# Patient Record
Sex: Female | Born: 1995 | Race: Black or African American | Hispanic: No | Marital: Single | Smoking: Never smoker
Health system: Southern US, Community
[De-identification: ages and names within clinical notes are randomized; demographics above are authoritative.]

## PROBLEM LIST (undated history)

## (undated) DIAGNOSIS — J302 Other seasonal allergic rhinitis: Secondary | ICD-10-CM

## (undated) DIAGNOSIS — M779 Enthesopathy, unspecified: Secondary | ICD-10-CM

---
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
## 2015-03-14 ENCOUNTER — Emergency Department (INDEPENDENT_AMBULATORY_CARE_PROVIDER_SITE_OTHER)
Admission: EM | Admit: 2015-03-14 | Discharge: 2015-03-14 | Disposition: A | Discharge status: Home / Self Care | Payer: Medicaid Other | Source: Home / Self Care | Attending: Emergency Medicine | Admitting: Emergency Medicine

## 2015-03-14 ENCOUNTER — Encounter (HOSPITAL_COMMUNITY): Payer: Self-pay | Admitting: *Deleted

## 2015-03-14 DIAGNOSIS — J0140 Acute pansinusitis, unspecified: Secondary | ICD-10-CM

## 2015-03-14 HISTORY — DX: Enthesopathy, unspecified: M77.9

## 2015-03-14 HISTORY — DX: Other seasonal allergic rhinitis: J30.2

## 2015-03-14 MED ORDER — FLUTICASONE PROPIONATE 50 MCG/ACT NA SUSP: 1.0000 | Freq: Every day | NASAL | Status: DC

## 2015-03-14 MED ORDER — AMOXICILLIN-POT CLAVULANATE 875-125 MG PO TABS: 1.0000 | ORAL_TABLET | Freq: Two times a day (BID) | ORAL | Status: DC

## 2015-03-14 MED ORDER — CETIRIZINE HCL 10 MG PO TABS: 10.0000 mg | ORAL_TABLET | Freq: Every day | ORAL | Status: AC

## 2015-03-14 NOTE — ED Provider Notes (Signed)
CSN: [id number]     Arrival date & time 03/14/15  1910 History   First MD Initiated Contact with Patient 03/14/15 2000     Chief Complaint  Patient presents with  . Facial Pain   (Consider location/radiation/quality/duration/timing/severity/associated sxs/prior Treatment) HPI  She is a 19 year old woman here for evaluation of sinus pressure. She states she's had some issues with her sinuses for a month or so due to seasonal allergies. She has been taking Claritin daily, but stopped this when it didn't help much. In the last 3 days she has developed worsening sinus pressure, nasal congestion, headache, chills. No fevers. She does report a mild cough. No ear pain. She states she used to have worse problems with allergies, but this has been improved after her nasal surgery.  Past Medical History  Diagnosis Date  . Seasonal allergies   . Inflammation around joint     bilateral hips   Past Surgical History  Procedure Laterality Date  . Sinoplasty  2012    had something else with it, polyps removed   History reviewed. No pertinent family history. History  Substance Use Topics  . Smoking status: Never Smoker   . Smokeless tobacco: Not on file  . Alcohol Use: No   OB History    No data available     Review of Systems  Constitutional: Positive for chills. Negative for fever.  HENT: Positive for congestion and sinus pressure. Negative for ear pain, rhinorrhea, sore throat and trouble swallowing.   Respiratory: Positive for cough. Negative for shortness of breath.   Neurological: Positive for headaches.    Allergies  Zofran and Codeine  Home Medications   Prior to Admission medications   Medication Sig Start Date End Date Taking? Authorizing Provider  levonorgestrel-ethinyl estradiol (NORDETTE) 0.15-30 MG-MCG tablet Take 1 tablet by mouth daily.   Yes Historical Provider, MD  meloxicam (MOBIC) 15 MG tablet Take 15 mg by mouth daily.   Yes Historical Provider, MD   amoxicillin-clavulanate (AUGMENTIN) 875-125 MG per tablet Take 1 tablet by mouth 2 (two) times daily. 03/14/15   Charm Rings, MD  cetirizine (ZYRTEC) 10 MG tablet Take 1 tablet (10 mg total) by mouth daily. 03/14/15   Charm Rings, MD  fluticasone (FLONASE) 50 MCG/ACT nasal spray Place 1 spray into both nostrils daily. 03/14/15   Charm Rings, MD   BP 124/80 mmHg  Pulse 86  Temp(Src) 99.3 F (37.4 C) (Oral)  Resp 12  SpO2 97%  LMP 01/13/2015 Physical Exam  Constitutional: She is oriented to person, place, and time. She appears well-developed and well-nourished. No distress.  HENT:  Nose: Mucosal edema present. Right sinus exhibits maxillary sinus tenderness and frontal sinus tenderness. Left sinus exhibits maxillary sinus tenderness and frontal sinus tenderness.  Mouth/Throat: Oropharynx is clear and moist. No oropharyngeal exudate.  Eyes: Conjunctivae are normal.  Neck: Neck supple.  Cardiovascular: Normal rate, regular rhythm and normal heart sounds.   No murmur heard. Pulmonary/Chest: Effort normal and breath sounds normal. No respiratory distress. She has no wheezes. She has no rales.  Lymphadenopathy:    She has no cervical adenopathy.  Neurological: She is alert and oriented to person, place, and time.    ED Course  Procedures (including critical care time) Labs Review Labs Reviewed - No data to display  Imaging Review No results found.   MDM   1. Acute pansinusitis, recurrence not specified    We'll treat with Augmentin, Zyrtec, Flonase. Follow-up as needed.  Charm RingsErin J Wayland Baik, MD 03/14/15 2017

## 2015-03-14 NOTE — ED Notes (Signed)
C/o pressure in her face, congestion in her nose, runny nose, chills for 3 days off and on.  Did not check for fever.  Her allergies have been bothering her for 3 weeks.  Tried Mucinex D, Sudafed and another sinus congestion medicine.

## 2015-03-14 NOTE — Discharge Instructions (Signed)
You have a sinus infection. Take Augmentin 1 pill twice a day for 10 days. Eat yogurt or take a probiotic while on this antibiotic. Take Zyrtec daily for the next 2 weeks. Use Flonase daily for the next 2 weeks. Follow-up as needed.

## 2015-11-24 ENCOUNTER — Emergency Department (INDEPENDENT_AMBULATORY_CARE_PROVIDER_SITE_OTHER): Payer: Medicaid Other

## 2015-11-24 ENCOUNTER — Emergency Department (INDEPENDENT_AMBULATORY_CARE_PROVIDER_SITE_OTHER)
Admission: EM | Admit: 2015-11-24 | Discharge: 2015-11-24 | Disposition: A | Discharge status: Home / Self Care | Payer: Medicaid Other | Source: Home / Self Care | Attending: Family Medicine | Admitting: Family Medicine

## 2015-11-24 ENCOUNTER — Encounter (HOSPITAL_COMMUNITY): Payer: Self-pay | Admitting: *Deleted

## 2015-11-24 DIAGNOSIS — J111 Influenza due to unidentified influenza virus with other respiratory manifestations: Secondary | ICD-10-CM | POA: Diagnosis not present

## 2015-11-24 DIAGNOSIS — R69 Illness, unspecified: Principal | ICD-10-CM

## 2015-11-24 MED ORDER — AZITHROMYCIN 250 MG PO TABS: ORAL_TABLET | ORAL | Status: DC

## 2015-11-24 NOTE — Discharge Instructions (Signed)
Drink plenty of fluids as discussed, use medicine as prescribed, and mucinex or delsym for cough. Return or see your doctor if further problems °

## 2015-11-24 NOTE — ED Notes (Signed)
Pt  Reports  Symptoms   Of       Lingering   Congestion   With    Cough   And        heavyness  In  Chest         Symptoms  Became   Worse  Yesterday                Pt  States  The  Symptoms    Started  Several  Weeks  Ago      Symptoms  Not  releived  By  otc  meds

## 2015-11-24 NOTE — ED Provider Notes (Signed)
CSN: [id number]     Arrival date & time 11/24/15  1306 History   First MD Initiated Contact with Patient 11/24/15 1406     Chief Complaint  Patient presents with  . URI   (Consider location/radiation/quality/duration/timing/severity/associated sxs/prior Treatment) Patient is a 19 y.o. female presenting with URI. The history is provided by the patient.  URI Presenting symptoms: congestion, cough, fatigue, fever and rhinorrhea   Severity:  Mild Onset quality:  Gradual Duration:  2 weeks Progression:  Worsening (onset as uri but yest sob and fever developed.) Chronicity:  New Ineffective treatments:  OTC medications Associated symptoms: no wheezing     Past Medical History  Diagnosis Date  . Seasonal allergies   . Inflammation around joint     bilateral hips   Past Surgical History  Procedure Laterality Date  . Sinoplasty  2012    had something else with it, polyps removed   History reviewed. No pertinent family history. Social History  Substance Use Topics  . Smoking status: Never Smoker   . Smokeless tobacco: None  . Alcohol Use: No   OB History    No data available     Review of Systems  Constitutional: Positive for fever and fatigue.  HENT: Positive for congestion and rhinorrhea.   Respiratory: Positive for cough and shortness of breath. Negative for wheezing.   Cardiovascular: Negative.   Gastrointestinal: Negative.   Musculoskeletal: Negative.   Skin: Negative.   All other systems reviewed and are negative.   Allergies  Zofran and Codeine  Home Medications   Prior to Admission medications   Medication Sig Start Date End Date Taking? Authorizing Provider  amoxicillin-clavulanate (AUGMENTIN) 875-125 MG per tablet Take 1 tablet by mouth 2 (two) times daily. 03/14/15   Charm Rings, MD  azithromycin (ZITHROMAX Z-PAK) 250 MG tablet Take as directed on pack 11/24/15   Linna Hoff, MD  cetirizine (ZYRTEC) 10 MG tablet Take 1 tablet (10 mg total) by mouth  daily. 03/14/15   Charm Rings, MD  fluticasone (FLONASE) 50 MCG/ACT nasal spray Place 1 spray into both nostrils daily. 03/14/15   Charm Rings, MD  levonorgestrel-ethinyl estradiol (NORDETTE) 0.15-30 MG-MCG tablet Take 1 tablet by mouth daily.    Historical Provider, MD  meloxicam (MOBIC) 15 MG tablet Take 15 mg by mouth daily.    Historical Provider, MD   Meds Ordered and Administered this Visit  Medications - No data to display  BP 122/75 mmHg  Pulse 95  Temp(Src) 100 F (37.8 C) (Oral)  Resp 12  SpO2 95%  LMP 11/17/2015 No data found.   Physical Exam  Constitutional: She is oriented to person, place, and time. She appears well-developed and well-nourished. No distress.  Neck: Normal range of motion. Neck supple.  Cardiovascular: Normal heart sounds and normal pulses.  Tachycardia present.   Pulmonary/Chest: Effort normal. She has decreased breath sounds. She has no wheezes. She has rhonchi. She has no rales.  Neurological: She is alert and oriented to person, place, and time.  Skin: Skin is warm and dry.  Nursing note and vitals reviewed.   ED Course  Procedures (including critical care time)  Labs Review Labs Reviewed - No data to display  Imaging Review Dg Chest 2 View  11/24/2015  CLINICAL DATA:  19 year old female with chest pain, cough and fever for 2 weeks. Initial encounter. EXAM: CHEST  2 VIEW COMPARISON:  None. FINDINGS: The cardiomediastinal silhouette is unremarkable. There is no evidence of focal airspace  disease, pulmonary edema, suspicious pulmonary nodule/mass, pleural effusion, or pneumothorax. No acute bony abnormalities are identified. IMPRESSION: No active cardiopulmonary disease. Electronically Signed   By: Harmon PierJeffrey  Hu M.D.   On: 11/24/2015 14:38   X-rays reviewed and report per radiologist.   Visual Acuity Review  Right Eye Distance:   Left Eye Distance:   Bilateral Distance:    Right Eye Near:   Left Eye Near:    Bilateral Near:          MDM   1. Influenza-like illness        Linna HoffJames D Rheta Hemmelgarn, MD 11/24/15 [phone]

## 2016-01-15 ENCOUNTER — Emergency Department (INDEPENDENT_AMBULATORY_CARE_PROVIDER_SITE_OTHER)
Admission: EM | Admit: 2016-01-15 | Discharge: 2016-01-15 | Disposition: A | Discharge status: Home / Self Care | Payer: Medicaid Other | Source: Home / Self Care | Attending: Family Medicine | Admitting: Family Medicine

## 2016-01-15 ENCOUNTER — Other Ambulatory Visit (HOSPITAL_COMMUNITY)
Admission: RE | Admit: 2016-01-15 | Discharge: 2016-01-15 | Disposition: A | Discharge status: Home / Self Care | Payer: Medicaid Other | Source: Ambulatory Visit | Attending: Family Medicine | Admitting: Family Medicine

## 2016-01-15 ENCOUNTER — Encounter (HOSPITAL_COMMUNITY): Payer: Self-pay | Admitting: Emergency Medicine

## 2016-01-15 DIAGNOSIS — R350 Frequency of micturition: Secondary | ICD-10-CM | POA: Diagnosis present

## 2016-01-15 DIAGNOSIS — J3089 Other allergic rhinitis: Secondary | ICD-10-CM | POA: Insufficient documentation

## 2016-01-15 DIAGNOSIS — R300 Dysuria: Secondary | ICD-10-CM | POA: Diagnosis present

## 2016-01-15 DIAGNOSIS — R05 Cough: Secondary | ICD-10-CM

## 2016-01-15 DIAGNOSIS — R0982 Postnasal drip: Secondary | ICD-10-CM | POA: Diagnosis not present

## 2016-01-15 DIAGNOSIS — R059 Cough, unspecified: Secondary | ICD-10-CM

## 2016-01-15 LAB — POCT URINALYSIS DIP (DEVICE)
Bilirubin Urine: NEGATIVE
Glucose, UA: NEGATIVE mg/dL
Hgb urine dipstick: NEGATIVE
KETONES UR: NEGATIVE mg/dL
Leukocytes, UA: NEGATIVE
NITRITE: NEGATIVE
PH: 7 (ref 5.0–8.0)
Protein, ur: NEGATIVE mg/dL
Specific Gravity, Urine: 1.01 (ref 1.005–1.030)
Urobilinogen, UA: 0.2 mg/dL (ref 0.0–1.0)

## 2016-01-15 MED ORDER — CEPHALEXIN 500 MG PO CAPS: 500.0000 mg | ORAL_CAPSULE | Freq: Four times a day (QID) | ORAL | Status: AC

## 2016-01-15 NOTE — Discharge Instructions (Signed)
Allergic Rhinitis Recommend taking either Zyrtec, Claritin or Allegra on a daily basis. Drink plenty fluids and stay well-hydrated. Allergic rhinitis is when the mucous membranes in the nose respond to allergens. Allergens are particles in the air that cause your body to have an allergic reaction. This causes you to release allergic antibodies. Through a chain of events, these eventually cause you to release histamine into the blood stream. Although meant to protect the body, it is this release of histamine that causes your discomfort, such as frequent sneezing, congestion, and an itchy, runny nose.  CAUSES Seasonal allergic rhinitis (hay fever) is caused by pollen allergens that may come from grasses, trees, and weeds. Year-round allergic rhinitis (perennial allergic rhinitis) is caused by allergens such as house dust mites, pet dander, and mold spores. SYMPTOMS  Nasal stuffiness (congestion).  Itchy, runny nose with sneezing and tearing of the eyes. DIAGNOSIS Your health care provider can help you determine the allergen or allergens that trigger your symptoms. If you and your health care provider are unable to determine the allergen, skin or blood testing may be used. Your health care provider will diagnose your condition after taking your health history and performing a physical exam. Your health care provider may assess you for other related conditions, such as asthma, pink eye, or an ear infection. TREATMENT Allergic rhinitis does not have a cure, but it can be controlled by:  Medicines that block allergy symptoms. These may include allergy shots, nasal sprays, and oral antihistamines.  Avoiding the allergen. Hay fever may often be treated with antihistamines in pill or nasal spray forms. Antihistamines block the effects of histamine. There are over-the-counter medicines that may help with nasal congestion and swelling around the eyes. Check with your health care provider before taking or  giving this medicine. If avoiding the allergen or the medicine prescribed do not work, there are many new medicines your health care provider can prescribe. Stronger medicine may be used if initial measures are ineffective. Desensitizing injections can be used if medicine and avoidance does not work. Desensitization is when a patient is given ongoing shots until the body becomes less sensitive to the allergen. Make sure you follow up with your health care provider if problems continue. HOME CARE INSTRUCTIONS It is not possible to completely avoid allergens, but you can reduce your symptoms by taking steps to limit your exposure to them. It helps to know exactly what you are allergic to so that you can avoid your specific triggers. SEEK MEDICAL CARE IF:  You have a fever.  You develop a cough that does not stop easily (persistent).  You have shortness of breath.  You start wheezing.  Symptoms interfere with normal daily activities.   This information is not intended to replace advice given to you by your health care provider. Make sure you discuss any questions you have with your health care provider.   Document Released: 08/06/2001 Document Revised: 12/02/2014 Document Reviewed: 07/19/2013 Elsevier Interactive Patient Education 2016 [employer].  Dysuria Your urinalysis today is normal. We will perform a culture to grow out any abnormal organisms. In the meantime we will treat you empirically with Keflex. If you continue to have urinary symptoms especially if your urine cultures are negative he will need to follow-up with the urologist for a workup. He may have a condition called interstitial cystitis. This is treated differently from UTIs. Dysuria is pain or discomfort while urinating. The pain or discomfort may be felt in the tube that carries  urine out of the bladder (urethra) or in the surrounding tissue of the genitals. The pain may also be felt in the groin area, lower abdomen, and  lower back. You may have to urinate frequently or have the sudden feeling that you have to urinate (urgency). Dysuria can affect both men and women, but is more common in women. Dysuria can be caused by many different things, including:  Urinary tract infection in women.  Infection of the kidney or bladder.  Kidney stones or bladder stones.  Certain sexually transmitted infections (STIs), such as chlamydia.  Dehydration.  Inflammation of the vagina.  Use of certain medicines.  Use of certain soaps or scented products that cause irritation. HOME CARE INSTRUCTIONS Watch your dysuria for any changes. The following actions may help to reduce any discomfort you are feeling:  Drink enough fluid to keep your urine clear or pale yellow.  Empty your bladder often. Avoid holding urine for long periods of time.  After a bowel movement or urination, women should cleanse from front to back, using each tissue only once.  Empty your bladder after sexual intercourse.  Take medicines only as directed by your health care provider.  If you were prescribed an antibiotic medicine, finish it all even if you start to feel better.  Avoid caffeine, tea, and alcohol. They can irritate the bladder and make dysuria worse. In men, alcohol may irritate the prostate.  Keep all follow-up visits as directed by your health care provider. This is important.  If you had any tests done to find the cause of dysuria, it is your responsibility to obtain your test results. Ask the lab or department performing the test when and how you will get your results. Talk with your health care provider if you have any questions about your results. SEEK MEDICAL CARE IF:  You develop pain in your back or sides.  You have a fever.  You have nausea or vomiting.  You have blood in your urine.  You are not urinating as often as you usually do. SEEK IMMEDIATE MEDICAL CARE IF:  You pain is severe and not relieved with  medicines.  You are unable to hold down any fluids.  You or someone else notices a change in your mental function.  You have a rapid heartbeat at rest.  You have shaking or chills.  You feel extremely weak.   This information is not intended to replace advice given to you by your health care provider. Make sure you discuss any questions you have with your health care provider.   Document Released: 08/09/2004 Document Revised: 12/02/2014 Document Reviewed: 07/07/2014 Elsevier Interactive Patient Education 2016 [employer]  Hay Fever  Hay fever is a type of allergy that people have to things like grass, animals, or pollen from plants and flowers. It cannot be passed from one person to another. You cannot cure hay fever, but there are things that may help relieve your problems (symptoms). HOME CARE  Avoid the things that may be causing your problems.  Take all medicine as told by your doctor. GET HELP RIGHT AWAY IF:  You have asthma, a cough, and you start making whistling sounds when breathing (wheezing).  Your tongue or lips are puffy (swollen).  You have trouble breathing.  You feel lightheaded or like you will pass out (faint).  You have a fever.  Your problems are getting worse and your medicine is not helping.  Your treatment was working, but your problems have come back.  You are stuffed up (congested) and have pressure in your face.  You have a headache.  You have cold sweats. MAKE SURE YOU:  Understand these instructions.  Will watch your condition.  Will get help right away if you are not doing well or get worse.   This information is not intended to replace advice given to you by your health care provider. Make sure you discuss any questions you have with your health care provider.   Document Released: 03/13/2011 Document Revised: 02/03/2012 Document Reviewed: 05/24/2015 Elsevier Interactive Patient Education [employer]  Urinary  Frequency The number of times a normal person urinates depends upon how much liquid they take in and how much liquid they are losing. If the temperature is hot and there is high humidity, then the person will sweat more and usually breathe a little more frequently. These factors decrease the amount of frequency of urination that would be considered normal. The amount you drink is easily determined, but the amount of fluid lost is sometimes more difficult to calculate.  Fluid is lost in two ways:  Sensible fluid loss is usually measured by the amount of urine that you get rid of. Losses of fluid can also occur with diarrhea.  Insensible fluid loss is more difficult to measure. It is caused by evaporation. Insensible loss of fluid occurs through breathing and sweating. It usually ranges from a little less than a quart to a little more than a quart of fluid a day. In normal temperatures and activity levels, the average person may urinate 4 to 7 times in a 24-hour period. Needing to urinate more often than that could indicate a problem. If one urinates 4 to 7 times in 24 hours and has large volumes each time, that could indicate a different problem from one who urinates 4 to 7 times a day and has small volumes. The time of urinating is also important. Most urinating should be done during the waking hours. Getting up at night to urinate frequently can indicate some problems. CAUSES  The bladder is the organ in your lower abdomen that holds urine. Like a balloon, it swells some as it fills up. Your nerves sense this and tell you it is time to head for the bathroom. There are a number of reasons that you might feel the need to urinate more often than usual. They include:  Urinary tract infection. This is usually associated with other signs such as burning when you urinate.  In men, problems with the prostate (a walnut-size gland that is located near the tube that carries urine out of your body). There are two  reasons why the prostate can cause an increased frequency of urination:  An enlarged prostate that does not let the bladder empty well. If the bladder only half empties when you urinate, then it only has half the capacity to fill before you have to urinate again.  The nerves in the bladder become more hypersensitive with an increased size of the prostate even if the bladder empties completely.  Pregnancy.  Obesity. Excess weight is more likely to cause a problem for women than for men.  Bladder stones or other bladder problems.  Caffeine.  Alcohol.  Medications. For example, drugs that help the body get rid of extra fluid (diuretics) increase urine production. Some other medicines must be taken with lots of fluids.  Muscle or nerve weakness. This might be the result of a spinal cord injury, a stroke, multiple sclerosis, or Parkinson disease.  Long-standing diabetes can decrease the sensation of the bladder. This loss of sensation makes it harder to sense the bladder needs to be emptied. Over a period of years, the bladder is stretched out by constant overfilling. This weakens the bladder muscles so that the bladder does not empty well and has less capacity to fill with new urine.  Interstitial cystitis (also called painful bladder syndrome). This condition develops because the tissues that line the inside of the bladder are inflamed (inflammation is the body's way of reacting to injury or infection). It causes pain and frequent urination. It occurs in women more often than in men. DIAGNOSIS   To decide what might be causing your urinary frequency, your health care provider will probably:  Ask about symptoms you have noticed.  Ask about your overall health. This will include questions about any medications you are taking.  Do a physical examination.  Order some tests. These might include:  A blood test to check for diabetes or other health issues that could be contributing to the  problem.  Urine testing. This could measure the flow of urine and the pressure on the bladder.  A test of your neurological system (the brain, spinal cord, and nerves). This is the system that senses the need to urinate.  A bladder test to check whether it is emptying completely when you urinate.  Cystoscopy. This test uses a thin tube with a tiny camera on it. It offers a look inside your urethra and bladder to see if there are problems.  Imaging tests. You might be given a contrast dye and then asked to urinate. X-rays are taken to see how your bladder is working. TREATMENT  It is important for you to be evaluated to determine if the amount or frequency that you have is unusual or abnormal. If it is found to be abnormal, the cause should be determined and this can usually be found out easily. Depending upon the cause, treatment could include medication, stimulation of the nerves, or surgery. There are not too many things that you can do as an individual to change your urinary frequency. It is important that you balance the amount of fluid intake needed to compensate for your activity and the temperature. Medical problems will be diagnosed and taken care of by your physician. There is no particular bladder training such as Kegel exercises that you can do to help urinary frequency. This is an exercise that is usually recommended for people who have leaking of urine when they laugh, cough, or sneeze. HOME CARE INSTRUCTIONS   Take any medications your health care provider prescribed or suggested. Follow the directions carefully.  Practice any lifestyle changes that are recommended. These might include:  Drinking less fluid or drinking at different times of the day. If you need to urinate often during the night, for example, you may need to stop drinking fluids early in the evening.  Cutting down on caffeine or alcohol. They both can make you need to urinate more often than normal. Caffeine is  found in coffee, tea, and sodas.  Losing weight, if that is recommended.  Keep a journal or a log. You might be asked to record how much you drink and when and where you feel the need to urinate. This will also help evaluate how well the treatment provided by your physician is working. SEEK MEDICAL CARE IF:   Your need to urinate often gets worse.  You feel increased pain or irritation when you urinate.  You notice blood in your urine.  You have questions about any medications that your health care provider recommended.  You notice blood, pus, or swelling at the site of any test or treatment procedure.  You develop a fever of more than 100.7F (38.1C). SEEK IMMEDIATE MEDICAL CARE IF:  You develop a fever of more than 102.23F (38.9C).   This information is not intended to replace advice given to you by your health care provider. Make sure you discuss any questions you have with your health care provider.   Document Released: 09/07/2009 Document Revised: 12/02/2014 Document Reviewed: 09/07/2009 Elsevier Interactive Patient Education [employer]

## 2016-01-15 NOTE — ED Notes (Signed)
Uri/cough for two months uti symptoms for one week

## 2016-01-15 NOTE — ED Provider Notes (Signed)
CSN: [id number]     Arrival date & time 01/15/16  1259 History   First MD Initiated Contact with Patient 01/15/16 1314     Chief Complaint  Patient presents with  . URI  . Recurrent UTI   (Consider location/radiation/quality/duration/timing/severity/associated sxs/prior Treatment) HPI Comments: 20 year old female presents with a cough for 2 months. Denies fevers. She states she has to clear her throat frequently and endorses PND, occasional sore throat. Denies earache. She was seen here approximately a month ago and was treated with Z-Pak and she continued to have PND.  Second complaint is that of urinary frequency, decreased urine volume with each void and dysuria. She states she has recurrent urinary tract infection symptoms.   Past Medical History  Diagnosis Date  . Seasonal allergies   . Inflammation around joint     bilateral hips   Past Surgical History  Procedure Laterality Date  . Sinoplasty  2012    had something else with it, polyps removed   Family History  Problem Relation Age of Onset  . Heart failure Mother    Social History  Substance Use Topics  . Smoking status: Never Smoker   . Smokeless tobacco: None  . Alcohol Use: No   OB History    No data available     Review of Systems  Constitutional: Negative for fever, chills, activity change, appetite change and fatigue.  HENT: Positive for congestion and postnasal drip. Negative for facial swelling and sore throat.   Eyes: Negative.   Respiratory: Positive for cough. Negative for shortness of breath.   Cardiovascular: Negative.   Gastrointestinal: Negative.   Genitourinary: Positive for dysuria and frequency. Negative for vaginal bleeding, vaginal discharge and pelvic pain.  Musculoskeletal: Negative for neck pain and neck stiffness.  Skin: Negative for pallor and rash.  Neurological: Negative.   All other systems reviewed and are negative.   Allergies  Zofran and Codeine  Home Medications   Prior  to Admission medications   Medication Sig Start Date End Date Taking? Authorizing Provider  cephALEXin (KEFLEX) 500 MG capsule Take 1 capsule (500 mg total) by mouth 4 (four) times daily. 01/15/16   Hayden Rasmussen, NP  cetirizine (ZYRTEC) 10 MG tablet Take 1 tablet (10 mg total) by mouth daily. 03/14/15   Charm Rings, MD  fluticasone (FLONASE) 50 MCG/ACT nasal spray Place 1 spray into both nostrils daily. 03/14/15   Charm Rings, MD  levonorgestrel-ethinyl estradiol (NORDETTE) 0.15-30 MG-MCG tablet Take 1 tablet by mouth daily.    Historical Provider, MD  meloxicam (MOBIC) 15 MG tablet Take 15 mg by mouth daily.    Historical Provider, MD   Meds Ordered and Administered this Visit  Medications - No data to display  BP 127/77 mmHg  Pulse 86  Temp(Src) 98.9 F (37.2 C) (Oral)  Resp 16  SpO2 99%  LMP 01/08/2016 No data found.   Physical Exam  Constitutional: She is oriented to person, place, and time. She appears well-developed and well-nourished. No distress.  HENT:  Mouth/Throat: No oropharyngeal exudate.  Bilateral TMs are normal. Oropharynx with mild erythema, much cobblestoning and moderate clear PND.  Eyes: Conjunctivae and EOM are normal.  Neck: Normal range of motion. Neck supple.  Cardiovascular: Normal rate and regular rhythm.   Pulmonary/Chest: Effort normal and breath sounds normal. No respiratory distress. She has no wheezes. She has no rales.  Abdominal: Soft. There is no tenderness.  Musculoskeletal: Normal range of motion. She exhibits no edema.  Lymphadenopathy:  She has no cervical adenopathy.  Neurological: She is alert and oriented to person, place, and time.  Skin: Skin is warm and dry. No rash noted.  Psychiatric: She has a normal mood and affect.  Nursing note and vitals reviewed.   ED Course  Procedures (including critical care time)  Labs Review Labs Reviewed  URINE CULTURE  POCT URINALYSIS DIP (DEVICE)   Results for orders placed or performed during  the hospital encounter of 01/15/16  POCT urinalysis dip (device)  Result Value Ref Range   Glucose, UA NEGATIVE NEGATIVE mg/dL   Bilirubin Urine NEGATIVE NEGATIVE   Ketones, ur NEGATIVE NEGATIVE mg/dL   Specific Gravity, Urine 1.010 1.005 - 1.030   Hgb urine dipstick NEGATIVE NEGATIVE   pH 7.0 5.0 - 8.0   Protein, ur NEGATIVE NEGATIVE mg/dL   Urobilinogen, UA 0.2 0.0 - 1.0 mg/dL   Nitrite NEGATIVE NEGATIVE   Leukocytes, UA NEGATIVE NEGATIVE     Imaging Review No results found.   Visual Acuity Review  Right Eye Distance:   Left Eye Distance:   Bilateral Distance:    Right Eye Near:   Left Eye Near:    Bilateral Near:         MDM   1. PND (post-nasal drip)   2. Cough   3. Other allergic rhinitis   4. Dysuria   5. Urinary frequency    Urine cult pending   Recommend taking either Zyrtec, Claritin or Allegra on a daily basis. Drink plenty fluids and stay well-hydrated. Your urinalysis today is normal. We will perform a culture to grow out any abnormal organisms. In the meantime we will treat you empirically with Keflex. If you continue to have urinary symptoms especially if your urine cultures are negative he will need to follow-up with the urologist for a workup. He may have a condition called interstitial cystitis. This is treated differently from UTIs.   Hayden Rasmussen, NP 01/15/16 1341

## 2016-01-17 ENCOUNTER — Telehealth: Payer: Self-pay | Admitting: Internal Medicine

## 2016-01-17 DIAGNOSIS — N390 Urinary tract infection, site not specified: Secondary | ICD-10-CM

## 2016-01-17 LAB — URINE CULTURE
Culture: 20000
Special Requests: NORMAL

## 2016-01-17 MED ORDER — SULFAMETHOXAZOLE-TRIMETHOPRIM 800-160 MG PO TABS: 1.0000 | ORAL_TABLET | Freq: Two times a day (BID) | ORAL | Status: AC

## 2016-01-17 NOTE — ED Notes (Signed)
Urine culture growing enterobacter resistant to keflex but sensitive to bactrim.  Will send rx bactrim to pharmacy of record ([facility] on [address]).  Recheck for persistent symptoms.  LM  Eustace Moore, MD 01/17/16 [phone]

## 2016-01-18 ENCOUNTER — Emergency Department (HOSPITAL_COMMUNITY): Payer: Medicaid Other

## 2016-01-18 ENCOUNTER — Encounter (HOSPITAL_COMMUNITY): Payer: Self-pay | Admitting: Emergency Medicine

## 2016-01-18 ENCOUNTER — Emergency Department (HOSPITAL_COMMUNITY)
Admission: EM | Admit: 2016-01-18 | Discharge: 2016-01-18 | Disposition: A | Discharge status: Home / Self Care | Payer: Medicaid Other | Attending: Emergency Medicine | Admitting: Emergency Medicine

## 2016-01-18 DIAGNOSIS — R059 Cough, unspecified: Secondary | ICD-10-CM

## 2016-01-18 DIAGNOSIS — Z793 Long term (current) use of hormonal contraceptives: Secondary | ICD-10-CM | POA: Insufficient documentation

## 2016-01-18 DIAGNOSIS — R0982 Postnasal drip: Secondary | ICD-10-CM

## 2016-01-18 DIAGNOSIS — J3489 Other specified disorders of nose and nasal sinuses: Secondary | ICD-10-CM | POA: Insufficient documentation

## 2016-01-18 DIAGNOSIS — M791 Myalgia: Secondary | ICD-10-CM | POA: Diagnosis not present

## 2016-01-18 DIAGNOSIS — R058 Other specified cough: Secondary | ICD-10-CM

## 2016-01-18 DIAGNOSIS — R0981 Nasal congestion: Secondary | ICD-10-CM | POA: Diagnosis not present

## 2016-01-18 DIAGNOSIS — G8929 Other chronic pain: Secondary | ICD-10-CM | POA: Insufficient documentation

## 2016-01-18 DIAGNOSIS — R05 Cough: Secondary | ICD-10-CM

## 2016-01-18 DIAGNOSIS — R52 Pain, unspecified: Secondary | ICD-10-CM

## 2016-01-18 MED ORDER — LORATADINE 10 MG PO TABS: 10.0000 mg | ORAL_TABLET | Freq: Every day | ORAL | Status: AC

## 2016-01-18 MED ORDER — BENZONATATE 200 MG PO CAPS: 200.0000 mg | ORAL_CAPSULE | Freq: Three times a day (TID) | ORAL | Status: AC | PRN

## 2016-01-18 NOTE — ED Notes (Signed)
Pt. reports chronic productive coug  /post nasal drip and chest congestion for 2 months , denies  fever or chills , seen at [facility] urgent care 3 days ago for the same complaints, currently taking antibiotic for UTI.

## 2016-01-18 NOTE — ED Provider Notes (Signed)
CSN: [id number]     Arrival date & time 01/18/16  0107 History   First MD Initiated Contact with Patient 01/18/16 [phone]     Chief Complaint  Patient presents with  . Cough     (Consider location/radiation/quality/duration/timing/severity/associated sxs/prior Treatment) HPI Comments: Jackie Porter is a 20 y.o. female with a PMHx of seasonal allergies and chronic b/l hip pain, who presents to the ED with complaints of constant mostly dry cough 3 months worse in the last 4 days. She states that occasionally she gets some sputum production, but it is a mostly dry cough. She states that initially it seemed to begin right before the new year she was treated with a Z-Pak, overall her symptoms improved but her cough persisted. Over the last 4 days her cough is worsened. She also has clear rhinorrhea, body aches, and postnasal drip. She has tried over-the-counter cold and flu medicines, Mucinex, and Sudafed with no relief, no known aggravating factors. She does endorse some sick contacts recently. She denies any fevers, chills, sore throat, wheezing, chest pain, shortness breath, ear pain or drainage, eye itching or redness, eye drainage, abdominal pain, nausea, vomiting, diarrhea, constipation, dysuria, hematuria, numbness, tingling, weakness, or smoking. She does not take anything for her seasonal allergies.   Patient is a 20 y.o. female presenting with cough. The history is provided by the patient and medical records. No language interpreter was used.  Cough Cough characteristics:  Dry Severity:  Moderate Onset quality:  Gradual Duration:  12 weeks Timing:  Constant Progression:  Unchanged Chronicity:  New Smoker: no   Context: sick contacts and upper respiratory infection   Relieved by:  Nothing Worsened by:  Nothing tried Ineffective treatments: OTC cold meds, mucinex, sudafed. Associated symptoms: myalgias (body aches), rhinorrhea and sinus congestion   Associated symptoms: no chest pain,  no chills, no ear pain, no eye discharge, no fever, no shortness of breath and no wheezing     Past Medical History  Diagnosis Date  . Seasonal allergies   . Inflammation around joint     bilateral hips   Past Surgical History  Procedure Laterality Date  . Sinoplasty  2012    had something else with it, polyps removed   Family History  Problem Relation Age of Onset  . Heart failure Mother    Social History  Substance Use Topics  . Smoking status: Never Smoker   . Smokeless tobacco: None  . Alcohol Use: No   OB History    No data available     Review of Systems  Constitutional: Negative for fever and chills.  HENT: Positive for postnasal drip and rhinorrhea. Negative for ear discharge, ear pain and trouble swallowing.   Eyes: Negative for discharge, redness and itching.  Respiratory: Positive for cough. Negative for shortness of breath and wheezing.   Cardiovascular: Negative for chest pain.  Gastrointestinal: Negative for nausea, vomiting, abdominal pain, diarrhea and constipation.  Genitourinary: Negative for dysuria and hematuria.  Musculoskeletal: Positive for myalgias (body aches). Negative for arthralgias.  Skin: Negative for color change.  Allergic/Immunologic: Negative for immunocompromised state.  Neurological: Negative for weakness and numbness.  Psychiatric/Behavioral: Negative for confusion.   10 Systems reviewed and are negative for acute change except as noted in the HPI.    Allergies  Zofran and Codeine  Home Medications   Prior to Admission medications   Medication Sig Start Date End Date Taking? Authorizing Provider  levonorgestrel-ethinyl estradiol (NORDETTE) 0.15-30 MG-MCG tablet Take 1 tablet by mouth  daily.   Yes Historical Provider, MD  sulfamethoxazole-trimethoprim (BACTRIM DS,SEPTRA DS) 800-160 MG tablet Take 1 tablet by mouth 2 (two) times daily. 01/17/16 01/24/16 Yes Eustace Moore, MD  cephALEXin (KEFLEX) 500 MG capsule Take 1 capsule (500  mg total) by mouth 4 (four) times daily. Patient not taking: Reported on 01/18/2016 01/15/16   Hayden Rasmussen, NP  cetirizine (ZYRTEC) 10 MG tablet Take 1 tablet (10 mg total) by mouth daily. Patient not taking: Reported on 01/18/2016 03/14/15   Charm Rings, MD  fluticasone [facility]) 50 MCG/ACT nasal spray Place 1 spray into both nostrils daily. Patient not taking: Reported on 01/18/2016 03/14/15   Charm Rings, MD   BP 119/81 mmHg  Pulse 85  Temp(Src) 98.1 F (36.7 C) (Oral)  Resp 17  Ht 5\' 5"  (1.651 m)  Wt 64.864 kg  BMI 23.80 kg/m2  SpO2 100%  LMP 01/08/2016 Physical Exam  Constitutional: She is oriented to person, place, and time. Vital signs are normal. She appears well-developed and well-nourished.  Non-toxic appearance. No distress.  Afebrile, nontoxic, NAD  HENT:  Head: Normocephalic and atraumatic.  Nose: Mucosal edema and rhinorrhea present.  Mouth/Throat: Uvula is midline and mucous membranes are normal. No trismus in the jaw. No uvula swelling. Posterior oropharyngeal erythema present. No oropharyngeal exudate, posterior oropharyngeal edema or tonsillar abscesses.  Nose with mucosal edema and clear rhinorrhea. Oropharynx mildly injected and moist, without uvular swelling or deviation, no trismus or drooling, no tonsillar swelling, no exudates.   Eyes: Conjunctivae and EOM are normal. Right eye exhibits no discharge. Left eye exhibits no discharge.  Neck: Normal range of motion. Neck supple.  Cardiovascular: Normal rate, regular rhythm, normal heart sounds and intact distal pulses.  Exam reveals no gallop and no friction rub.   No murmur heard. Pulmonary/Chest: Effort normal and breath sounds normal. No respiratory distress. She has no decreased breath sounds. She has no wheezes. She has no rhonchi. She has no rales.  CTAB in all lung fields, no w/r/r, no hypoxia or increased WOB, speaking in full sentences, SpO2 100% on RA   Abdominal: Soft. Normal appearance and bowel sounds are  normal. She exhibits no distension. There is no tenderness. There is no rigidity, no rebound, no guarding, no CVA tenderness, no tenderness at McBurney's point and negative Murphy's sign.  Musculoskeletal: Normal range of motion.  Neurological: She is alert and oriented to person, place, and time. She has normal strength. No sensory deficit.  Skin: Skin is warm, dry and intact. No rash noted.  Psychiatric: She has a normal mood and affect.  Nursing note and vitals reviewed.   ED Course  Procedures (including critical care time) Labs Review Labs Reviewed - No data to display  Imaging Review Dg Chest 2 View  01/18/2016  CLINICAL DATA:  Chronic intermittent cough and acute onset of fever. Initial encounter. EXAM: CHEST  2 VIEW COMPARISON:  Chest radiograph performed 11/24/2015 FINDINGS: The lungs are well-aerated and clear. There is no evidence of focal opacification, pleural effusion or pneumothorax. The heart is normal in size; the mediastinal contour is within normal limits. No acute osseous abnormalities are seen. Bilateral nipple piercings are noted. IMPRESSION: No acute cardiopulmonary process seen. Electronically Signed   By: Roanna Raider M.D.   On: 01/18/2016 02:38   I have personally reviewed and evaluated these images and lab results as part of my medical decision-making.   EKG Interpretation None      MDM   Final diagnoses:  Cough  Post-viral cough syndrome  Post-nasal drip  Nasal congestion  Body aches    20 y.o. female here with cough x3 months, seems as though she may have had a viral URI near the new year and then got initially better but the cough persisted. Now having more rhinorrhea and post-nasal drip which is likely adding to the post-viral tussive syndrome picture. Clear lung exam, mild rhinorrhea, moderate nasal turbinate congestion. Discussed symptomatic control, flonase use, antihistamines, and will give her tessalon perles to help with cough. F/up with PCP in  1wk. CXR clear today. Likely viral/post-viral etiology. I explained the diagnosis and have given explicit precautions to return to the ER including for any other new or worsening symptoms. The patient understands and accepts the medical plan as it's been dictated and I have answered their questions. Discharge instructions concerning home care and prescriptions have been given. The patient is STABLE and is discharged to home in good condition.  BP 113/73 mmHg  Pulse 79  Temp(Src) 98.1 F (36.7 C) (Oral)  Resp 17  Ht  (1.651 m)  Wt 64.864 kg  BMI 23.80 kg/m2  SpO2 100%  LMP 01/08/2016  Meds ordered this encounter  Medications  . benzonatate (TESSALON) 200 MG capsule    Sig: Take 1 capsule (200 mg total) by mouth 3 (three) times daily as needed for cough.    Dispense:  20 capsule    Refill:  0    Order Specific Question:  Supervising Provider    Answer:  MILLER, BRIAN [[id number]]  . loratadine (CLARITIN) 10 MG tablet    Sig: Take 1 tablet (10 mg total) by mouth daily.    Dispense:  30 tablet    Refill:  0    Order Specific Question:  Supervising Provider    Answer:  Eber Hong [[id number]]       Rabon Scholle Camprubi-Soms, PA-C 01/18/16 1610  Loren Racer, MD 01/18/16 [phone]

## 2016-01-18 NOTE — Discharge Instructions (Signed)
Continue to stay well-hydrated. Gargle warm salt water and spit it out. Use chloraseptic spray as needed for sore throat. Use Tesslon pearls for cough suppression. Continue to alternate between Tylenol and Ibuprofen for pain or fever. Use Mucinex for cough suppression/expectoration of mucus. Use netipot and flonase to help with nasal congestion. Start taking claritin or other over-the-counter antihistamine to decrease secretions and for post-nasal drip. Followup with your primary care doctor in 5-7 days for recheck of ongoing symptoms. Return to emergency department for emergent changing or worsening of symptoms.   Cough, Adult Coughing is a reflex that clears your throat and your airways. Coughing helps to heal and protect your lungs. It is normal to cough occasionally, but a cough that happens with other symptoms or lasts a long time may be a sign of a condition that needs treatment. A cough may last only 2-3 weeks (acute), or it may last longer than 8 weeks (chronic). CAUSES Coughing is commonly caused by:  Breathing in substances that irritate your lungs.  A viral or bacterial respiratory infection.  Allergies.  Asthma.  Postnasal drip.  Smoking.  Acid backing up from the stomach into the esophagus (gastroesophageal reflux).  Certain medicines.  Chronic lung problems, including COPD (or rarely, lung cancer).  Other medical conditions such as heart failure. HOME CARE INSTRUCTIONS  Pay attention to any changes in your symptoms. Take these actions to help with your discomfort:  Take medicines only as told by your health care provider.  If you were prescribed an antibiotic medicine, take it as told by your health care provider. Do not stop taking the antibiotic even if you start to feel better.  Talk with your health care provider before you take a cough suppressant medicine.  Drink enough fluid to keep your urine clear or pale yellow.  If the air is dry, use a cold steam  vaporizer or humidifier in your bedroom or your home to help loosen secretions.  Avoid anything that causes you to cough at work or at home.  If your cough is worse at night, try sleeping in a semi-upright position.  Avoid cigarette smoke. If you smoke, quit smoking. If you need help quitting, ask your health care provider.  Avoid caffeine.  Avoid alcohol.  Rest as needed. SEEK MEDICAL CARE IF:   You have new symptoms.  You cough up pus.  Your cough does not get better after 2-3 weeks, or your cough gets worse.  You cannot control your cough with suppressant medicines and you are losing sleep.  You develop pain that is getting worse or pain that is not controlled with pain medicines.  You have a fever.  You have unexplained weight loss.  You have night sweats. SEEK IMMEDIATE MEDICAL CARE IF:  You cough up blood.  You have difficulty breathing.  Your heartbeat is very fast.   This information is not intended to replace advice given to you by your health care provider. Make sure you discuss any questions you have with your health care provider.   Document Released: 05/10/2011 Document Revised: 08/02/2015 Document Reviewed: 01/18/2015 Elsevier Interactive Patient Education [employer]  Allergies An allergy is when your body reacts to a substance in a way that is not normal. An allergic reaction can happen after you:  Eat something.  Breathe in something.  Touch something. WHAT KINDS OF ALLERGIES ARE THERE? You can be allergic to:  Things that are only around during certain seasons, like molds and pollens.  Foods.  Drugs.  Insects.  Animal dander. WHAT ARE SYMPTOMS OF ALLERGIES?  Puffiness (swelling). This may happen on the lips, face, tongue, mouth, or throat.  Sneezing.  Coughing.  Breathing loudly (wheezing).  Stuffy nose.  Tingling in the mouth.  A rash.  Itching.  Itchy, red, puffy areas of skin (hives).  Watery  eyes.  Throwing up (vomiting).  Watery poop (diarrhea).  Dizziness.  Feeling faint or fainting.  Trouble breathing or swallowing.  A tight feeling in the chest.  A fast heartbeat. HOW ARE ALLERGIES DIAGNOSED? Allergies can be diagnosed with:  A medical and family history.  Skin tests.  Blood tests.  A food diary. A food diary is a record of all the foods, drinks, and symptoms you have each day.  The results of an elimination diet. This diet involves making sure not to eat certain foods and then seeing what happens when you start eating them again. HOW ARE ALLERGIES TREATED? There is no cure for allergies, but allergic reactions can be treated with medicine. Severe reactions usually need to be treated at a hospital.  HOW CAN REACTIONS BE PREVENTED? The best way to prevent an allergic reaction is to avoid the thing you are allergic to. Allergy shots and medicines can also help prevent reactions in some cases.   This information is not intended to replace advice given to you by your health care provider. Make sure you discuss any questions you have with your health care provider.   Document Released: 03/08/2013 Document Revised: 12/02/2014 Document Reviewed: 08/23/2014 Elsevier Interactive Patient Education [employer]

## 2016-01-18 NOTE — ED Notes (Signed)
Per pt, she has had a dry and sometimes productive cough x 3 months. States she has "felt bad" since Sunday and that prompted her visit today.

## 2016-01-20 ENCOUNTER — Telehealth (HOSPITAL_COMMUNITY): Payer: Self-pay | Admitting: Emergency Medicine

## 2016-01-20 NOTE — ED Notes (Signed)
Called pt and notified of recent lab results from visit 2/20 Pt ID'd properly... Reports feeling better and sx have subsided Was able to pick Antibiotic after visiting ER   Per Dr. Dayton Scrape,  Urine cx growingg enterobacter resistant to keflex but sensitive to bactrim. Rx for bactrim sent to pharmacy of record, [facility] on [address].  Stop keflex. Recheck for persistent symptoms. LM  Adv pt if sx are not getting better to return  Pt verb understanding.

## 2016-03-11 ENCOUNTER — Ambulatory Visit (INDEPENDENT_AMBULATORY_CARE_PROVIDER_SITE_OTHER): Payer: Medicaid Other

## 2016-03-11 ENCOUNTER — Ambulatory Visit (HOSPITAL_COMMUNITY)
Admission: EM | Admit: 2016-03-11 | Discharge: 2016-03-11 | Disposition: A | Discharge status: Home / Self Care | Payer: Medicaid Other | Attending: Family Medicine | Admitting: Family Medicine

## 2016-03-11 ENCOUNTER — Encounter (HOSPITAL_COMMUNITY): Payer: Self-pay | Admitting: *Deleted

## 2016-03-11 DIAGNOSIS — J302 Other seasonal allergic rhinitis: Secondary | ICD-10-CM

## 2016-03-11 MED ORDER — PREDNISONE 50 MG PO TABS: ORAL_TABLET | ORAL | Status: AC

## 2016-03-11 MED ORDER — FEXOFENADINE HCL 180 MG PO TABS: 180.0000 mg | ORAL_TABLET | Freq: Every day | ORAL | Status: AC

## 2016-03-11 MED ORDER — FLUTICASONE PROPIONATE 50 MCG/ACT NA SUSP: 1.0000 | Freq: Two times a day (BID) | NASAL | Status: AC

## 2016-03-11 NOTE — ED Provider Notes (Signed)
CSN: [id number]     Arrival date & time 03/11/16  1305 History   First MD Initiated Contact with Patient 03/11/16 1504     Chief Complaint  Patient presents with  . Sinus Problem   (Consider location/radiation/quality/duration/timing/severity/associated sxs/prior Treatment) Patient is a 20 y.o. female presenting with sinus complaint. The history is provided by the patient.  Sinus Problem This is a new problem. The current episode started more than 2 days ago. The problem has been gradually worsening.    Past Medical History  Diagnosis Date  . Seasonal allergies   . Inflammation around joint     bilateral hips   Past Surgical History  Procedure Laterality Date  . Sinoplasty  2012    had something else with it, polyps removed   Family History  Problem Relation Age of Onset  . Heart failure Mother    Social History  Substance Use Topics  . Smoking status: Never Smoker   . Smokeless tobacco: None  . Alcohol Use: No   OB History    No data available     Review of Systems  Constitutional: Positive for fever.  HENT: Positive for congestion, postnasal drip, rhinorrhea, sinus pressure, sneezing and sore throat.   Respiratory: Negative.   Cardiovascular: Negative.   Genitourinary: Negative.   All other systems reviewed and are negative.   Allergies  Zofran and Codeine  Home Medications   Prior to Admission medications   Medication Sig Start Date End Date Taking? Authorizing Provider  benzonatate (TESSALON) 200 MG capsule Take 1 capsule (200 mg total) by mouth 3 (three) times daily as needed for cough. 01/18/16   Mercedes Camprubi-Soms, PA-C  cephALEXin (KEFLEX) 500 MG capsule Take 1 capsule (500 mg total) by mouth 4 (four) times daily. Patient not taking: Reported on 01/18/2016 01/15/16   Hayden Rasmussenavid Mabe, NP  cetirizine (ZYRTEC) 10 MG tablet Take 1 tablet (10 mg total) by mouth daily. Patient not taking: Reported on 01/18/2016 03/14/15   Charm RingsErin J Honig, MD  fexofenadine (ALLEGRA)  180 MG tablet Take 1 tablet (180 mg total) by mouth daily. 03/11/16   Linna HoffJames D Milly Goggins, MD  fluticasone (FLONASE) 50 MCG/ACT nasal spray Place 1 spray into both nostrils 2 (two) times daily. 03/11/16   Linna HoffJames D Modine Oppenheimer, MD  levonorgestrel-ethinyl estradiol (NORDETTE) 0.15-30 MG-MCG tablet Take 1 tablet by mouth daily.    Historical Provider, MD  loratadine (CLARITIN) 10 MG tablet Take 1 tablet (10 mg total) by mouth daily. 01/18/16   Mercedes Camprubi-Soms, PA-C  predniSONE (DELTASONE) 50 MG tablet 1 tab daily for 2 days then 1/2 tab daily for 2 days. 03/11/16   Linna HoffJames D Marlyn Rabine, MD   Meds Ordered and Administered this Visit  Medications - No data to display  BP 118/79 mmHg  Pulse 99  Temp(Src) 98.6 F (37 C) (Oral)  Resp 16  SpO2 100%  LMP 02/26/2016 No data found.   Physical Exam  Constitutional: She is oriented to person, place, and time. She appears well-developed and well-nourished.  HENT:  Right Ear: External ear normal.  Left Ear: External ear normal.  Nose: Mucosal edema and rhinorrhea present. Right sinus exhibits maxillary sinus tenderness. Left sinus exhibits maxillary sinus tenderness.  Mouth/Throat: Oropharynx is clear and moist.  Neck: Normal range of motion. Neck supple.  Cardiovascular: Normal heart sounds.   Pulmonary/Chest: Effort normal and breath sounds normal.  Lymphadenopathy:    She has no cervical adenopathy.  Neurological: She is alert and oriented to person, place, and  time.  Skin: Skin is warm and dry.  Nursing note and vitals reviewed.   ED Course  Procedures (including critical care time)  Labs Review Labs Reviewed - No data to display  Imaging Review Dg Sinuses Complete  03/11/2016  CLINICAL DATA:  Fever.  Sinus pain and pressure. EXAM: PARANASAL SINUSES - COMPLETE 3 + VIEW COMPARISON:  None. FINDINGS: The paranasal sinus are aerated. There is no evidence of sinus opacification air-fluid levels or mucosal thickening. No significant bone abnormalities are  seen. IMPRESSION: Negative. Electronically Signed   By: Francene Boyers M.D.   On: 03/11/2016 15:37   X-rays reviewed and report per radiologist.   Visual Acuity Review  Right Eye Distance:   Left Eye Distance:   Bilateral Distance:    Right Eye Near:   Left Eye Near:    Bilateral Near:         MDM   1. Seasonal allergic rhinitis        Linna Hoff, MD 03/11/16 1600

## 2016-03-11 NOTE — ED Notes (Signed)
Pt  Reports  Symptoms  Of  Cough   Congested        With  Sinus  Drainage         X  3  Days       Pt   Reports  Symptoms  Of    Sinus    Pt   Reports              Symptoms         Not relieved        By  otc  meds

## 2017-08-02 IMAGING — CR DG CHEST 2V
2 series · 2 of 2 positions shown · non-contrast
Comparison: Chest radiograph performed 11/24/2015

CLINICAL DATA: Chronic intermittent cough and acute onset of fever.
Initial encounter.

EXAM:
CHEST  2 VIEW

[chest pa]
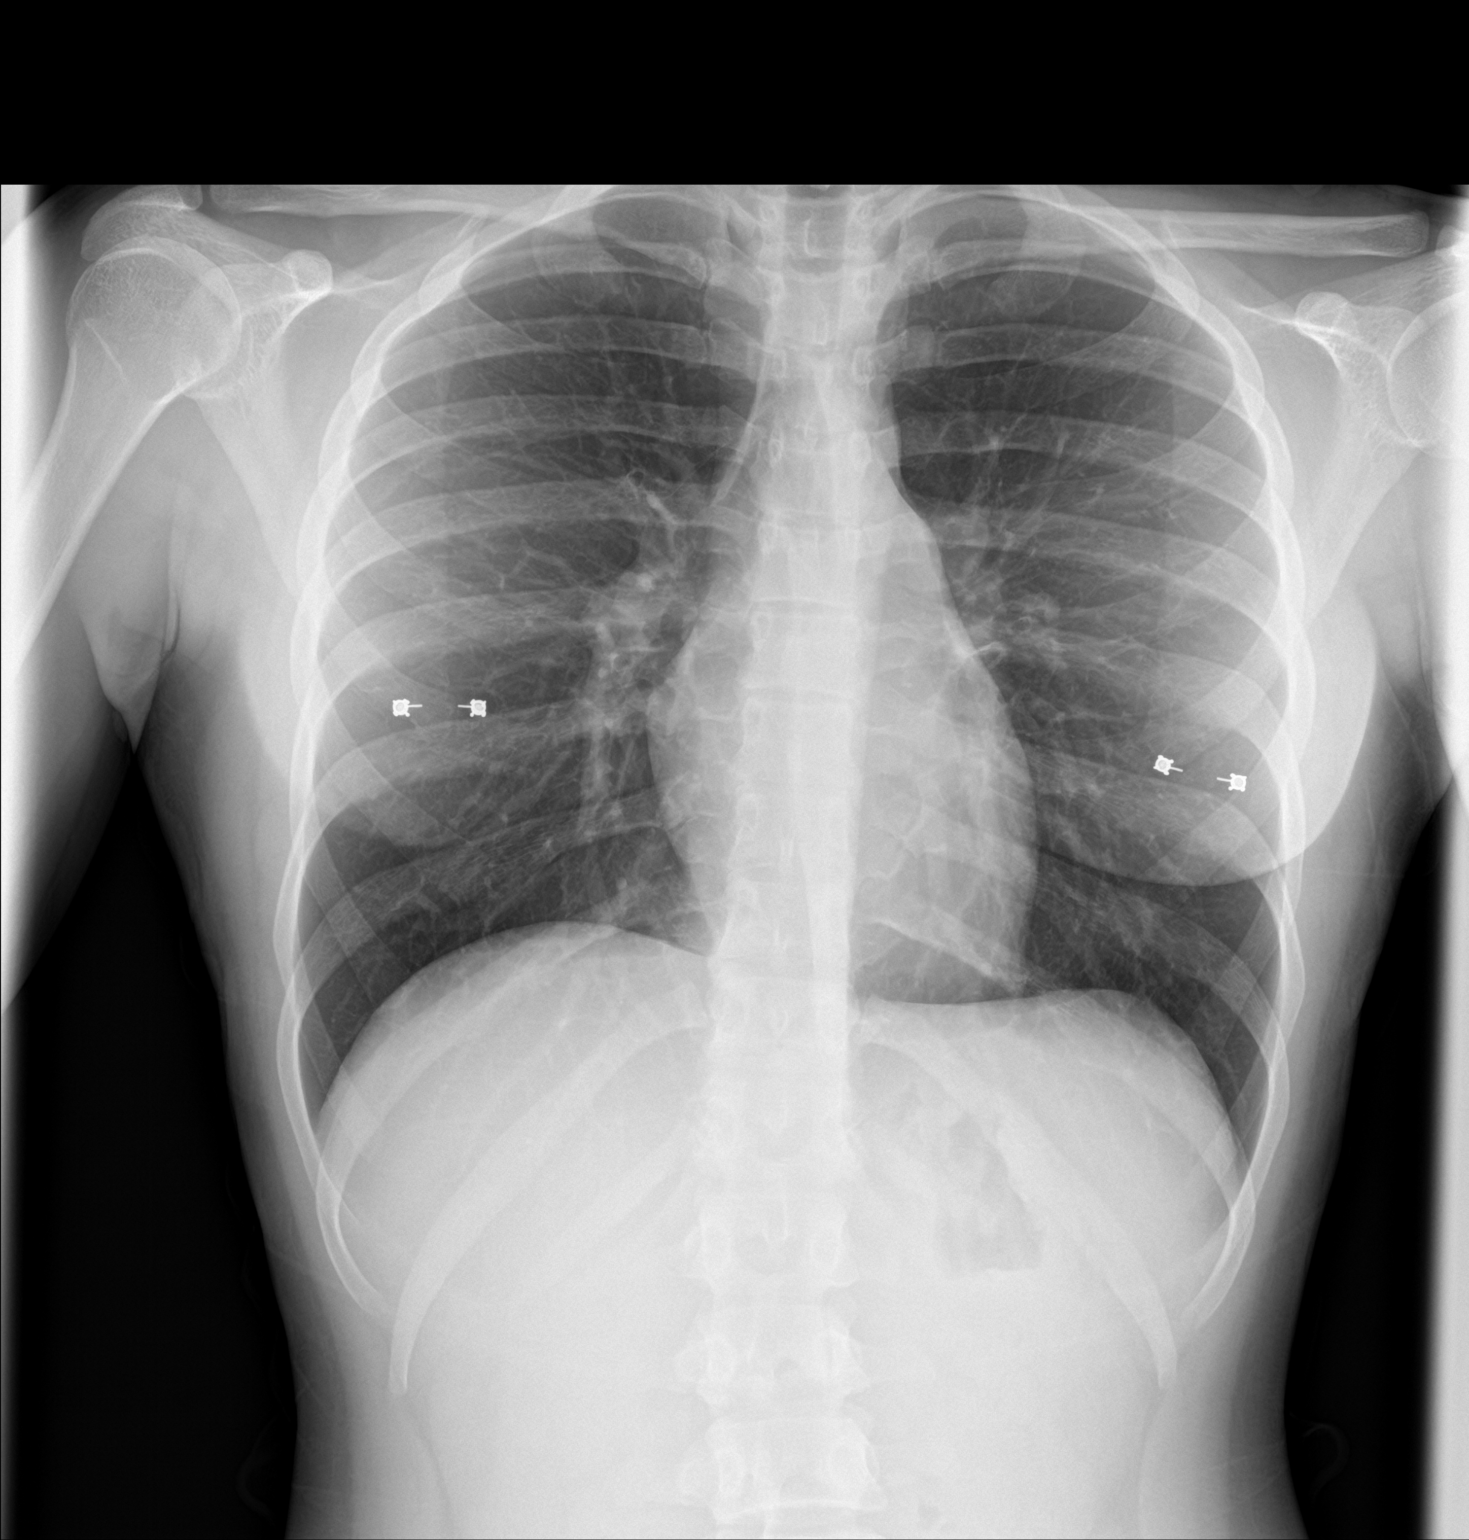

[chest lat]
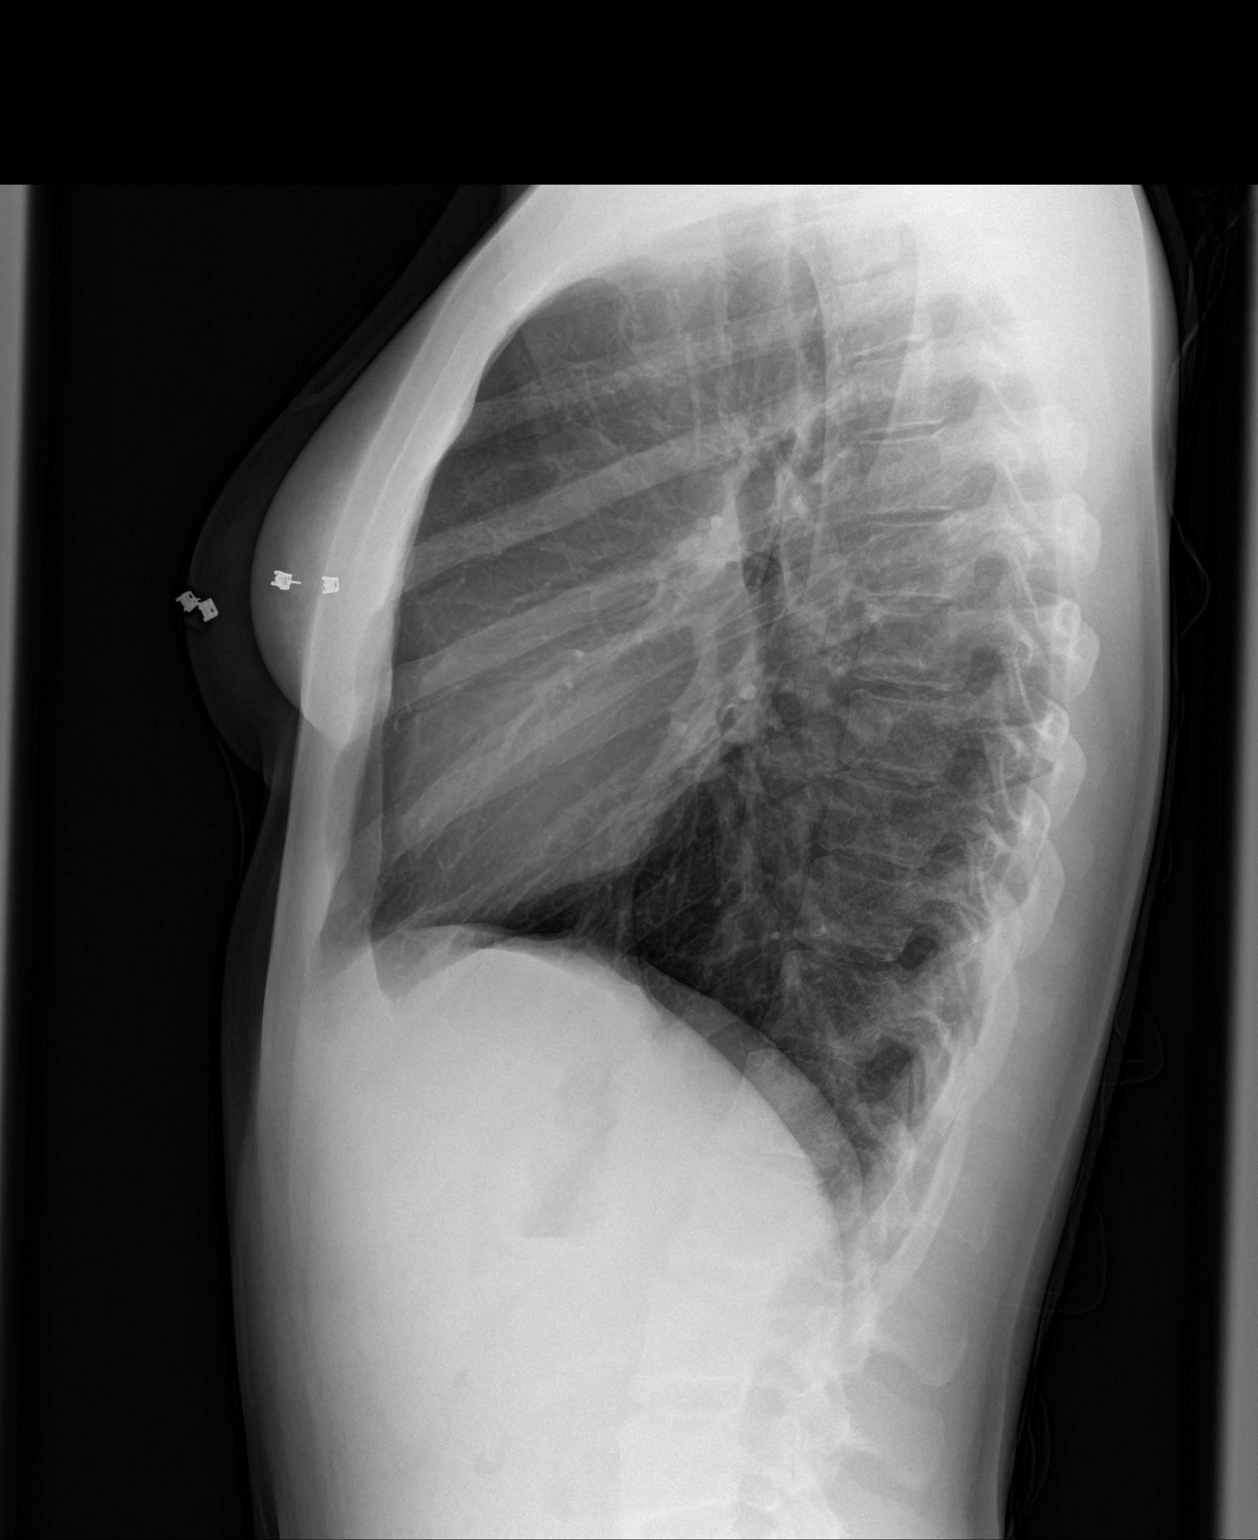

[2 of 2 positions shown; findings below may reference images not displayed]

FINDINGS: The lungs are well-aerated and clear. There is no evidence of focal
opacification, pleural effusion or pneumothorax.

The heart is normal in size; the mediastinal contour is within
normal limits. No acute osseous abnormalities are seen. Bilateral
nipple piercings are noted.
IMPRESSION: No acute cardiopulmonary process seen.
